# Patient Record
Sex: Male | Born: 1945 | Race: White | Hispanic: No | Marital: Married | Smoking: Former smoker
Health system: Southern US, Community
[De-identification: ages and names within clinical notes are randomized; demographics above are authoritative.]

## PROBLEM LIST (undated history)

## (undated) DIAGNOSIS — C801 Malignant (primary) neoplasm, unspecified: Secondary | ICD-10-CM

## (undated) DIAGNOSIS — I10 Essential (primary) hypertension: Secondary | ICD-10-CM

## (undated) DIAGNOSIS — I4891 Unspecified atrial fibrillation: Secondary | ICD-10-CM

## (undated) HISTORY — PX: PROSTATE SURGERY: SHX751

## (undated) HISTORY — PX: HERNIA REPAIR: SHX51

## (undated) HISTORY — PX: APPENDECTOMY: SHX54

## (undated) HISTORY — PX: OTHER SURGICAL HISTORY: SHX169

---

## 2020-03-17 ENCOUNTER — Other Ambulatory Visit: Payer: Self-pay

## 2020-03-17 ENCOUNTER — Encounter (HOSPITAL_BASED_OUTPATIENT_CLINIC_OR_DEPARTMENT_OTHER): Payer: Self-pay | Admitting: Emergency Medicine

## 2020-03-17 ENCOUNTER — Emergency Department (HOSPITAL_BASED_OUTPATIENT_CLINIC_OR_DEPARTMENT_OTHER): Payer: Medicare Other

## 2020-03-17 ENCOUNTER — Emergency Department (HOSPITAL_BASED_OUTPATIENT_CLINIC_OR_DEPARTMENT_OTHER)
Admission: EM | Admit: 2020-03-17 | Discharge: 2020-03-17 | Disposition: A | Discharge status: Home / Self Care | Payer: Medicare Other | Attending: Emergency Medicine | Admitting: Emergency Medicine

## 2020-03-17 DIAGNOSIS — U071 COVID-19: Secondary | ICD-10-CM

## 2020-03-17 DIAGNOSIS — I4891 Unspecified atrial fibrillation: Secondary | ICD-10-CM | POA: Diagnosis not present

## 2020-03-17 DIAGNOSIS — Z87891 Personal history of nicotine dependence: Secondary | ICD-10-CM | POA: Insufficient documentation

## 2020-03-17 DIAGNOSIS — R0602 Shortness of breath: Secondary | ICD-10-CM | POA: Diagnosis present

## 2020-03-17 DIAGNOSIS — J441 Chronic obstructive pulmonary disease with (acute) exacerbation: Secondary | ICD-10-CM

## 2020-03-17 DIAGNOSIS — I10 Essential (primary) hypertension: Secondary | ICD-10-CM | POA: Insufficient documentation

## 2020-03-17 DIAGNOSIS — Z8546 Personal history of malignant neoplasm of prostate: Secondary | ICD-10-CM | POA: Diagnosis not present

## 2020-03-17 HISTORY — DX: Malignant (primary) neoplasm, unspecified: C80.1

## 2020-03-17 HISTORY — DX: Unspecified atrial fibrillation: I48.91

## 2020-03-17 HISTORY — DX: Essential (primary) hypertension: I10

## 2020-03-17 LAB — RESP PANEL BY RT-PCR (FLU A&B, COVID) ARPGX2
Influenza A by PCR: NEGATIVE
Influenza B by PCR: NEGATIVE
SARS Coronavirus 2 by RT PCR: POSITIVE — AB

## 2020-03-17 LAB — CBC
HCT: 33.8 % — ABNORMAL LOW (ref 39.0–52.0)
Hemoglobin: 11.3 g/dL — ABNORMAL LOW (ref 13.0–17.0)
MCH: 30.2 pg (ref 26.0–34.0)
MCHC: 33.4 g/dL (ref 30.0–36.0)
MCV: 90.4 fL (ref 80.0–100.0)
Platelets: 229 10*3/uL (ref 150–400)
RBC: 3.74 MIL/uL — ABNORMAL LOW (ref 4.22–5.81)
RDW: 15.7 % — ABNORMAL HIGH (ref 11.5–15.5)
WBC: 10.9 10*3/uL — ABNORMAL HIGH (ref 4.0–10.5)
nRBC: 0 % (ref 0.0–0.2)

## 2020-03-17 LAB — BASIC METABOLIC PANEL
Anion gap: 11 (ref 5–15)
BUN: 13 mg/dL (ref 8–23)
CO2: 22 mmol/L (ref 22–32)
Calcium: 8.2 mg/dL — ABNORMAL LOW (ref 8.9–10.3)
Chloride: 100 mmol/L (ref 98–111)
Creatinine, Ser: 1.08 mg/dL (ref 0.61–1.24)
GFR, Estimated: >60 mL/min (ref >60)
Glucose, Bld: 128 mg/dL — ABNORMAL HIGH (ref 70–99)
Potassium: 3.5 mmol/L (ref 3.5–5.1)
Sodium: 133 mmol/L — ABNORMAL LOW (ref 135–145)

## 2020-03-17 MED ORDER — IPRATROPIUM BROMIDE HFA 17 MCG/ACT IN AERS
2.0000 | INHALATION_SPRAY | Freq: Once | RESPIRATORY_TRACT | Status: AC
  Administered 2020-03-17: 14:00:00 2 via RESPIRATORY_TRACT
  Filled 2020-03-17: qty 12.9

## 2020-03-17 MED ORDER — PREDNISONE 50 MG PO TABS
60.0000 mg | ORAL_TABLET | Freq: Once | ORAL | Status: AC
  Administered 2020-03-17: 13:00:00 60 mg via ORAL
  Filled 2020-03-17: qty 1

## 2020-03-17 MED ORDER — ALBUTEROL SULFATE HFA 108 (90 BASE) MCG/ACT IN AERS
4.0000 | INHALATION_SPRAY | Freq: Once | RESPIRATORY_TRACT | Status: AC
  Administered 2020-03-17: 14:00:00 4 via RESPIRATORY_TRACT
  Filled 2020-03-17: qty 6.7

## 2020-03-17 MED ORDER — PREDNISONE 50 MG PO TABS
50.0000 mg | ORAL_TABLET | Freq: Every day | ORAL | 0 refills | Status: AC
Start: 2020-03-17 — End: ?

## 2020-03-17 MED ORDER — BENZONATATE 100 MG PO CAPS
100.0000 mg | ORAL_CAPSULE | Freq: Three times a day (TID) | ORAL | 0 refills | Status: AC
Start: 2020-03-17 — End: ?

## 2020-03-17 NOTE — Discharge Instructions (Addendum)
Take the steroids as prescribed.  You should get a call from the monoclonal antibody infusion clinic.  Return to the emergency room for worsening trouble with your breathing

## 2020-03-17 NOTE — ED Notes (Signed)
Patient ambulated in room. Minimal SOB noted, SAT 94-95%

## 2020-03-17 NOTE — ED Triage Notes (Addendum)
Pt c/o cough and shortness of breath x 2-3 days. Wife of patient currently taking antibiotics for bronchitis. Pr reports pulse ox at home was in the upper 80's.

## 2020-03-17 NOTE — ED Provider Notes (Signed)
[facility] EMERGENCY DEPARTMENT Provider Note   CSN: [id number] Arrival date & time: 03/17/20  1210     History Chief Complaint  Patient presents with  . Shortness of Breath    Wesley Abbott is a 74 y.o. male.  HPI   Patient presents to the ED with complaints of shortness of breath.  Patient states the symptoms started a few days ago.  The symptoms are not severe.  He noticed that his oxygen level dropped into the high 80s at home today.  He has been having some trouble with cough and congestion.  He is not having any trouble with fevers.  He is not having any trouble with chest pain.  He denies any leg swelling.  Patient has been vaccinated for Covid.  Patient's wife is currently being treated for bronchitis.  Past Medical History:  Diagnosis Date  . A-fib ([place])   . Cancer [facility])    prostate  . Hypertension     There are no problems to display for this patient.   Past Surgical History:  Procedure Laterality Date  . APPENDECTOMY    . cardiac stents    . HERNIA REPAIR    . PROSTATE SURGERY         No family history on file.  Social History   Tobacco Use  . Smoking status: Former Research scientist (life sciences)  . Smokeless tobacco: Never Used  Vaping Use  . Vaping Use: Never used  Substance Use Topics  . Alcohol use: Not Currently  . Drug use: Never    Home Medications Prior to Admission medications   Medication Sig Start Date End Date Taking? Authorizing Provider  predniSONE (DELTASONE) 50 MG tablet Take 1 tablet (50 mg total) by mouth daily. 03/17/20   Dorie Rank, MD    Allergies    Fexofenadine and Rosuvastatin  Review of Systems   Review of Systems  All other systems reviewed and are negative.   Physical Exam Updated Vital Signs BP (!) 142/68   Pulse 62   Temp 98 F (36.7 C) (Oral)   Resp (!) 21   Ht 1.829 m (6')   Wt 108.9 kg   SpO2 94%   BMI 32.55 kg/m   Physical Exam Vitals and nursing note reviewed.  Constitutional:      General: He is not  in acute distress.    Appearance: He is well-developed and well-nourished.  HENT:     Head: Normocephalic and atraumatic.     Right Ear: External ear normal.     Left Ear: External ear normal.  Eyes:     General: No scleral icterus.       Right eye: No discharge.        Left eye: No discharge.     Conjunctiva/sclera: Conjunctivae normal.  Neck:     Trachea: No tracheal deviation.  Cardiovascular:     Rate and Rhythm: Normal rate and regular rhythm.     Pulses: Intact distal pulses.  Pulmonary:     Effort: Pulmonary effort is normal. No respiratory distress.     Breath sounds: No stridor. Wheezing present. No rales.     Comments: Able to speak in full sentences Abdominal:     General: Bowel sounds are normal. There is no distension.     Palpations: Abdomen is soft.     Tenderness: There is no abdominal tenderness. There is no guarding or rebound.  Musculoskeletal:        General: No tenderness or edema.  Cervical back: Neck supple.  Skin:    General: Skin is warm and dry.     Findings: No rash.  Neurological:     Mental Status: He is alert.     Cranial Nerves: No cranial nerve deficit (no facial droop, extraocular movements intact, no slurred speech).     Sensory: No sensory deficit.     Motor: No abnormal muscle tone or seizure activity.     Coordination: Coordination normal.     Deep Tendon Reflexes: Strength normal.  Psychiatric:        Mood and Affect: Mood and affect normal.     ED Results / Procedures / Treatments   Labs (all labs ordered are listed, but only abnormal results are displayed) Labs Reviewed  RESP PANEL BY RT-PCR (FLU A&B, COVID) ARPGX2 - Abnormal; Notable for the following components:      Result Value   SARS Coronavirus 2 by RT PCR POSITIVE (*)    All other components within normal limits  BASIC METABOLIC PANEL - Abnormal; Notable for the following components:   Sodium 133 (*)    Glucose, Bld 128 (*)    Calcium 8.2 (*)    All other  components within normal limits  CBC - Abnormal; Notable for the following components:   WBC 10.9 (*)    RBC 3.74 (*)    Hemoglobin 11.3 (*)    HCT 33.8 (*)    RDW 15.7 (*)    All other components within normal limits    EKG EKG Interpretation  Date/Time:  Sunday March 17 2020 12:31:53 EST Ventricular Rate:  63 PR Interval:  200 QRS Duration: 144 QT Interval:  470 QTC Calculation: 480 R Axis:   -80 Text Interpretation: Normal sinus rhythm Right bundle branch block Left anterior fascicular block Bifascicular block Abnormal ECG not Confirmed by Dorie Rank [phone]) on 03/17/2020 12:44:10 PM   Radiology DG Chest Port 1 View  Result Date: 03/17/2020 CLINICAL DATA:  Cough and shortness of breath for several days EXAM: PORTABLE CHEST 1 VIEW COMPARISON:  07/20/2018 FINDINGS: Cardiac Wesley is within normal limits. Aortic calcifications are noted. No focal infiltrate or effusion is seen. No bony abnormality is noted. IMPRESSION: No active disease. Electronically Signed   By: Inez Catalina M.D.   On: 03/17/2020 12:51    Procedures Procedures (including critical care time)  Medications Ordered in ED Medications  albuterol (VENTOLIN HFA) 108 (90 Base) MCG/ACT inhaler 4 puff (4 puffs Inhalation Given 03/17/20 1335)  ipratropium (ATROVENT HFA) inhaler 2 puff (2 puffs Inhalation Given 03/17/20 1335)  predniSONE (DELTASONE) tablet 60 mg (60 mg Oral Given 03/17/20 1315)    ED Course  I have reviewed the triage vital signs and the nursing notes.  Pertinent labs & imaging results that were available during my care of the patient were reviewed by me and considered in my medical decision making (see chart for details).  Clinical Course as of 03/17/20 1438  Sun Mar 17, 2020  1302 Chest x-ray negative for acute process [JK]  1352 Patient's Covid test is positive. [JK]  1352 No oxygen requirement noted [JK]  1357 Patient remains with oxygen saturation in the mid 90s without supplemental  oxygen. Will try having the patient ambulate to make sure he does not desaturate [JK]    Clinical Course User Index [JK] Dorie Rank, MD   MDM Rules/Calculators/A&P  Patient presented to the ED for evaluation of cough and congestion.  Patient does have history of COPD.  Patient has been vaccinated for Covid.  Patient states his wife was recently diagnosed with URI/bronchitis.  Patient's x-ray does not show pneumonia.  Laboratory tests are unremarkable.  Patient's Covid test is positive.  He is able to ambulate around the ED without oxygen desaturation.  No indication for hospitalization at this time.  Will refer patient to the monoclonal antibody infusion clinic.  Otherwise discharged home with a course of steroids as he does appear to have a component of mild COPD exacerbation. Final Clinical Impression(s) / ED Diagnoses Final diagnoses:  COVID-19 virus infection  COPD exacerbation ([place])    Rx / DC Orders ED Discharge Orders         Ordered    predniSONE (DELTASONE) 50 MG tablet  Daily        03/17/20 1434           Dorie Rank, MD 03/17/20 1438

## 2022-01-15 IMAGING — DX DG CHEST 1V PORT
2 series · 2 of 2 positions shown · non-contrast
Comparison: 07/20/2018

CLINICAL DATA: Cough and shortness of breath for several days

EXAM:
PORTABLE CHEST 1 VIEW

[chest ap (1 of 2)]
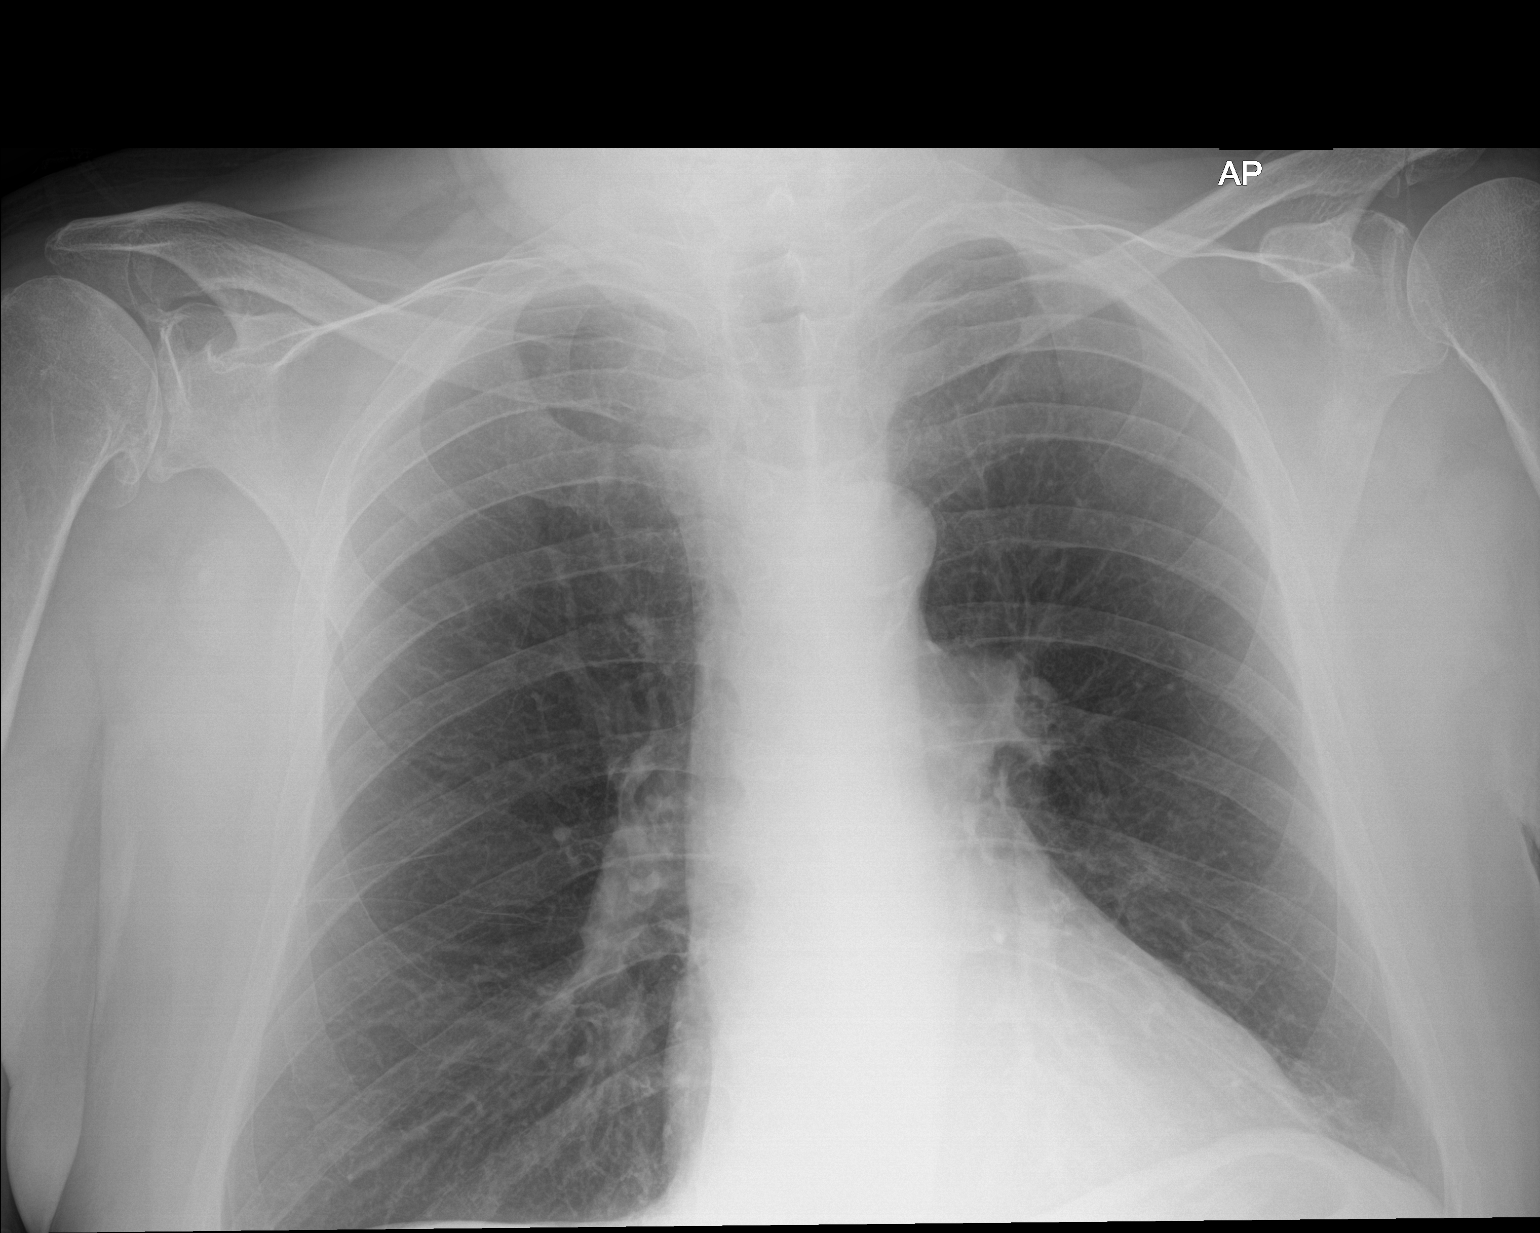

[chest ap (2 of 2)]
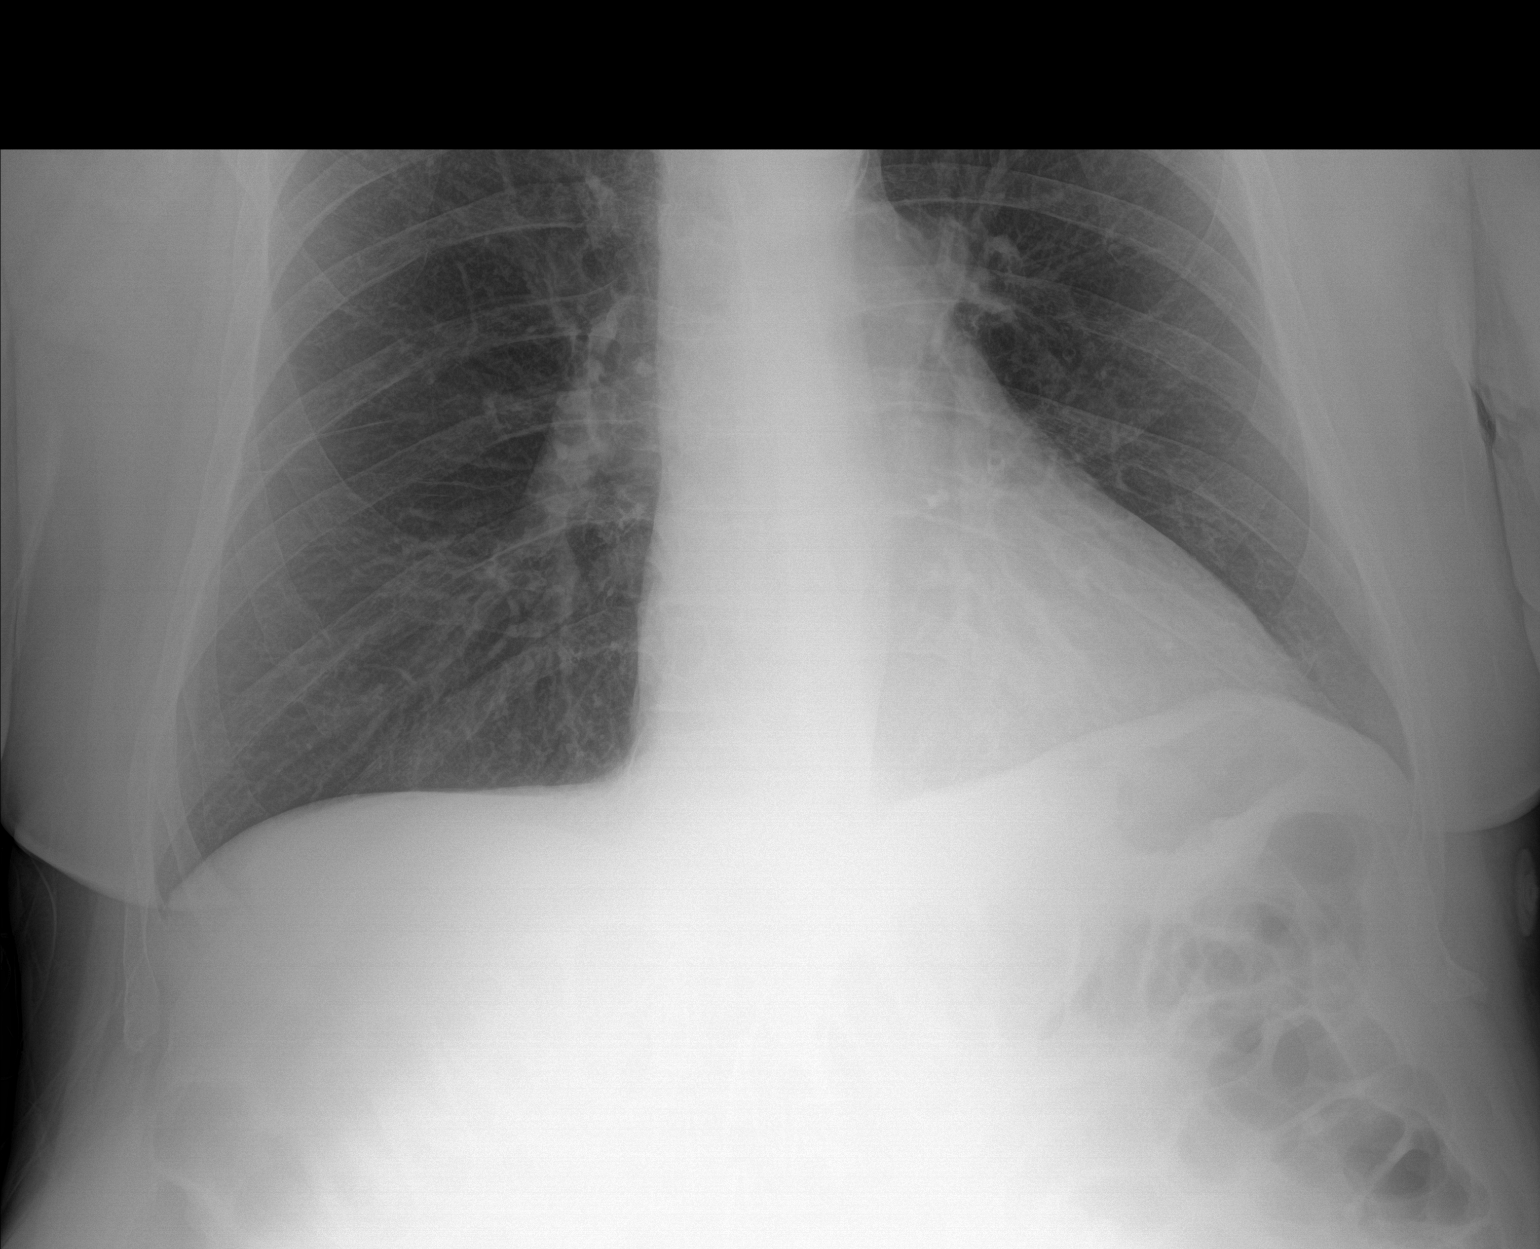

[2 of 2 positions shown; findings below may reference images not displayed]

FINDINGS: Cardiac shadow is within normal limits. Aortic calcifications are
noted. No focal infiltrate or effusion is seen. No bony abnormality
is noted.
IMPRESSION: No active disease.

## 2024-03-23 DEATH — deceased
